# Patient Record
Sex: Male | Born: 2000 | Race: White | Hispanic: No | Marital: Single | Smoking: Never smoker
Health system: Southern US, Community
[De-identification: ages and names within clinical notes are randomized; demographics above are authoritative.]

---
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
## 2020-07-12 ENCOUNTER — Other Ambulatory Visit: Payer: Self-pay

## 2020-07-12 ENCOUNTER — Emergency Department: Payer: Medicaid Other

## 2020-07-12 ENCOUNTER — Emergency Department
Admission: EM | Admit: 2020-07-12 | Discharge: 2020-07-12 | Disposition: A | Discharge status: Home / Self Care | Payer: Medicaid Other | Attending: Emergency Medicine | Admitting: Emergency Medicine

## 2020-07-12 DIAGNOSIS — R0602 Shortness of breath: Secondary | ICD-10-CM | POA: Insufficient documentation

## 2020-07-12 DIAGNOSIS — R0789 Other chest pain: Secondary | ICD-10-CM | POA: Insufficient documentation

## 2020-07-12 LAB — CBC
HCT: 46.2 % (ref 39.0–52.0)
Hemoglobin: 16 g/dL (ref 13.0–17.0)
MCH: 29.7 pg (ref 26.0–34.0)
MCHC: 34.6 g/dL (ref 30.0–36.0)
MCV: 85.9 fL (ref 80.0–100.0)
Platelets: 271 10*3/uL (ref 150–400)
RBC: 5.38 MIL/uL (ref 4.22–5.81)
RDW: 12.5 % (ref 11.5–15.5)
WBC: 10 10*3/uL (ref 4.0–10.5)
nRBC: 0 % (ref 0.0–0.2)

## 2020-07-12 LAB — TROPONIN I (HIGH SENSITIVITY)
Troponin I (High Sensitivity): 2 ng/L (ref <18)
Troponin I (High Sensitivity): <2 ng/L (ref <18)

## 2020-07-12 LAB — BASIC METABOLIC PANEL
Anion gap: 10 (ref 5–15)
BUN: 17 mg/dL (ref 6–20)
CO2: 24 mmol/L (ref 22–32)
Calcium: 9.3 mg/dL (ref 8.9–10.3)
Chloride: 102 mmol/L (ref 98–111)
Creatinine, Ser: 1.03 mg/dL (ref 0.61–1.24)
GFR calc non Af Amer: >60 mL/min (ref >60)
Glucose, Bld: 96 mg/dL (ref 70–99)
Potassium: 3.7 mmol/L (ref 3.5–5.1)
Sodium: 136 mmol/L (ref 135–145)

## 2020-07-12 MED ORDER — IBUPROFEN 800 MG PO TABS: 800.0000 mg | ORAL_TABLET | Freq: Three times a day (TID) | ORAL | 0 refills | Status: AC | PRN

## 2020-07-12 NOTE — Discharge Instructions (Signed)
Please use ibuprofen 800 mg every 8 hours for the next 7 days.

## 2020-07-12 NOTE — ED Triage Notes (Signed)
Pt with central chest tightness and shortness of breath for three days. Pt appears in no acute distress, denies cough or fever, states pain is worse when lying down.

## 2020-07-12 NOTE — ED Provider Notes (Signed)
[facility] Emergency Department Provider Note   ____________________________________________   First MD Initiated Contact with Patient 07/12/20 0800     (approximate)  I have reviewed the triage vital signs and the nursing notes.   HISTORY  Chief Complaint Shortness of Breath and Chest Pain    HPI Ernest Hall is a 19 y.o. male with no stated past medical history that presents for 3 days of worsening the central chest tightness with associated shortness of breath that is worsened with laying down and partially relieved while sitting upright.  Patient describes this pain as 3/10 that worsens to 7/10 when he is laying down.  Patient has not tried any medications for the symptoms.  Patient denies the shortness of breath or chest pain being exertional in nature.  Patient denies any personal or family history of heart disease.         No past medical history on file.  There are no problems to display for this patient.   No past surgical history on file.  Prior to Admission medications   Medication Sig Start Date End Date Taking? Authorizing Provider  ibuprofen (ADVIL) 800 MG tablet Take 1 tablet (800 mg total) by mouth every 8 (eight) hours as needed. 07/12/20   Merwyn Katos, MD    Allergies Amoxicillin and Penicillins  No family history on file.  Social History Social History   Tobacco Use  . Smoking status: Never Smoker  . Smokeless tobacco: Never Used  Vaping Use  . Vaping Use: Never used  Substance Use Topics  . Alcohol use: Never  . Drug use: Never    Review of Systems Constitutional: No fever/chills Eyes: No visual changes. ENT: No sore throat. Cardiovascular: Endorses chest pain. Respiratory: Endorses shortness of breath. Gastrointestinal: No abdominal pain.  No nausea, no vomiting.  No diarrhea. Genitourinary: Negative for dysuria. Musculoskeletal: Negative for acute arthralgias Skin: Negative for rash. Neurological:  Negative for headaches, weakness/numbness/paresthesias in any extremity Psychiatric: Negative for suicidal ideation/homicidal ideation   ____________________________________________   PHYSICAL EXAM:  VITAL SIGNS: ED Triage Vitals  Enc Vitals Group     BP 07/12/20 0450 (!) 161/89     Pulse Rate 07/12/20 0450 73     Resp 07/12/20 0450 18     Temp 07/12/20 0450 98.6 F (37 C)     Temp Source 07/12/20 0450 Oral     SpO2 07/12/20 0450 99 %     Weight 07/12/20 0451 300 lb (136.1 kg)     Height 07/12/20 0451 6\' 1"  (1.854 m)     Head Circumference --      Peak Flow --      Pain Score 07/12/20 0451 4     Pain Loc --      Pain Edu? --      Excl. in GC? --    Constitutional: Alert and oriented. Well appearing and in no acute distress. Eyes: Conjunctivae are normal. PERRL. Head: Atraumatic. Nose: No congestion/rhinnorhea. Mouth/Throat: Mucous membranes are moist. Neck: No stridor Cardiovascular: Grossly normal heart sounds with very mild rub when patient is laying flat.  Good peripheral circulation. Respiratory: Normal respiratory effort.  No retractions. Gastrointestinal: Soft and nontender. No distention. Musculoskeletal: No obvious deformities Neurologic:  Normal speech and language. No gross focal neurologic deficits are appreciated. Skin:  Skin is warm and dry. No rash noted. Psychiatric: Mood and affect are normal. Speech and behavior are normal.  ____________________________________________   LABS (all labs ordered are listed, but  only abnormal results are displayed)  Labs Reviewed  BASIC METABOLIC PANEL  CBC  TROPONIN I (HIGH SENSITIVITY)  TROPONIN I (HIGH SENSITIVITY)   ____________________________________________  EKG  ED ECG REPORT I, Merwyn Katos, the attending physician, personally viewed and interpreted this ECG.  Date: 07/12/2020 EKG Time: 0450 Rate: 89 Rhythm: normal sinus rhythm QRS Axis: normal Intervals: normal ST/T Wave abnormalities:  normal Narrative Interpretation: no evidence of acute ischemia  ____________________________________________  RADIOLOGY  ED MD interpretation: 2 view x-ray of the chest shows no evidence of acute findings including negative for pneumonia, pneumothorax, or widened mediastinum  Official radiology report(s): DG Chest 2 View  Result Date: 07/12/2020 CLINICAL DATA:  Shortness of breath and chest pain EXAM: CHEST - 2 VIEW COMPARISON:  None. FINDINGS: Normal heart size and mediastinal contours. Azygos fissure. No acute infiltrate or edema. No effusion or pneumothorax. No acute osseous findings. Exaggerated thoracic kyphosis with mild vertebral body anterior wedging, Scheuermann disease findings. IMPRESSION: No acute finding. Electronically Signed   By: Marnee Spring M.D.   On: 07/12/2020 05:43    ____________________________________________   PROCEDURES  Procedure(s) performed (including Critical Care):  .1-3 Lead EKG Interpretation Performed by: Merwyn Katos, MD Authorized by: Merwyn Katos, MD     Interpretation: normal     ECG rate:  81   ECG rate assessment: normal     Rhythm: sinus rhythm     Ectopy: none     Conduction: normal       ____________________________________________   INITIAL IMPRESSION / ASSESSMENT AND PLAN / ED COURSE  As part of my medical decision making, I reviewed the following data within the electronic MEDICAL RECORD NUMBER Nursing notes reviewed and incorporated, Labs reviewed, EKG interpreted, Old chart reviewed, Radiograph reviewed and Notes from prior ED visits reviewed and incorporated        Workup: ECG, CXR, CBC, BMP, Troponin Findings: ECG: No overt evidence of STEMI. No evidence of Brugadas sign, delta wave, epsilon wave, significantly prolonged QTc, or malignant arrhythmia HS Troponin: Negative x1 Other Labs unremarkable for emergent problems. CXR: Without PTX, PNA, or widened mediastinum Last Stress Test: Never Last Heart  Catheterization: Never HEART Score: 1  Given History, Exam, and Workup I have low suspicion for ACS, Pneumothorax, Pneumonia, Pulmonary Embolus, Tamponade, Aortic Dissection or other emergent problem as a cause for this presentation.   Reassesment: Prior to discharge patients pain was controlled and they were well appearing.  Disposition:  Discharge. Strict return precautions discussed with patient with full understanding. Advised patient to follow up promptly with primary care provider       ____________________________________________   FINAL CLINICAL IMPRESSION(S) / ED DIAGNOSES  Final diagnoses:  Atypical chest pain  Shortness of breath     ED Discharge Orders         Ordered    ibuprofen (ADVIL) 800 MG tablet  Every 8 hours PRN        07/12/20 0837           Note:  This document was prepared using Dragon voice recognition software and may include unintentional dictation errors.   Merwyn Katos, MD 07/12/20 [phone]

## 2021-04-08 IMAGING — CR DG CHEST 2V
2 series · 2 of 2 positions shown · non-contrast
Comparison: None.

CLINICAL DATA: Shortness of breath and chest pain

EXAM:
CHEST - 2 VIEW

[chest pa]
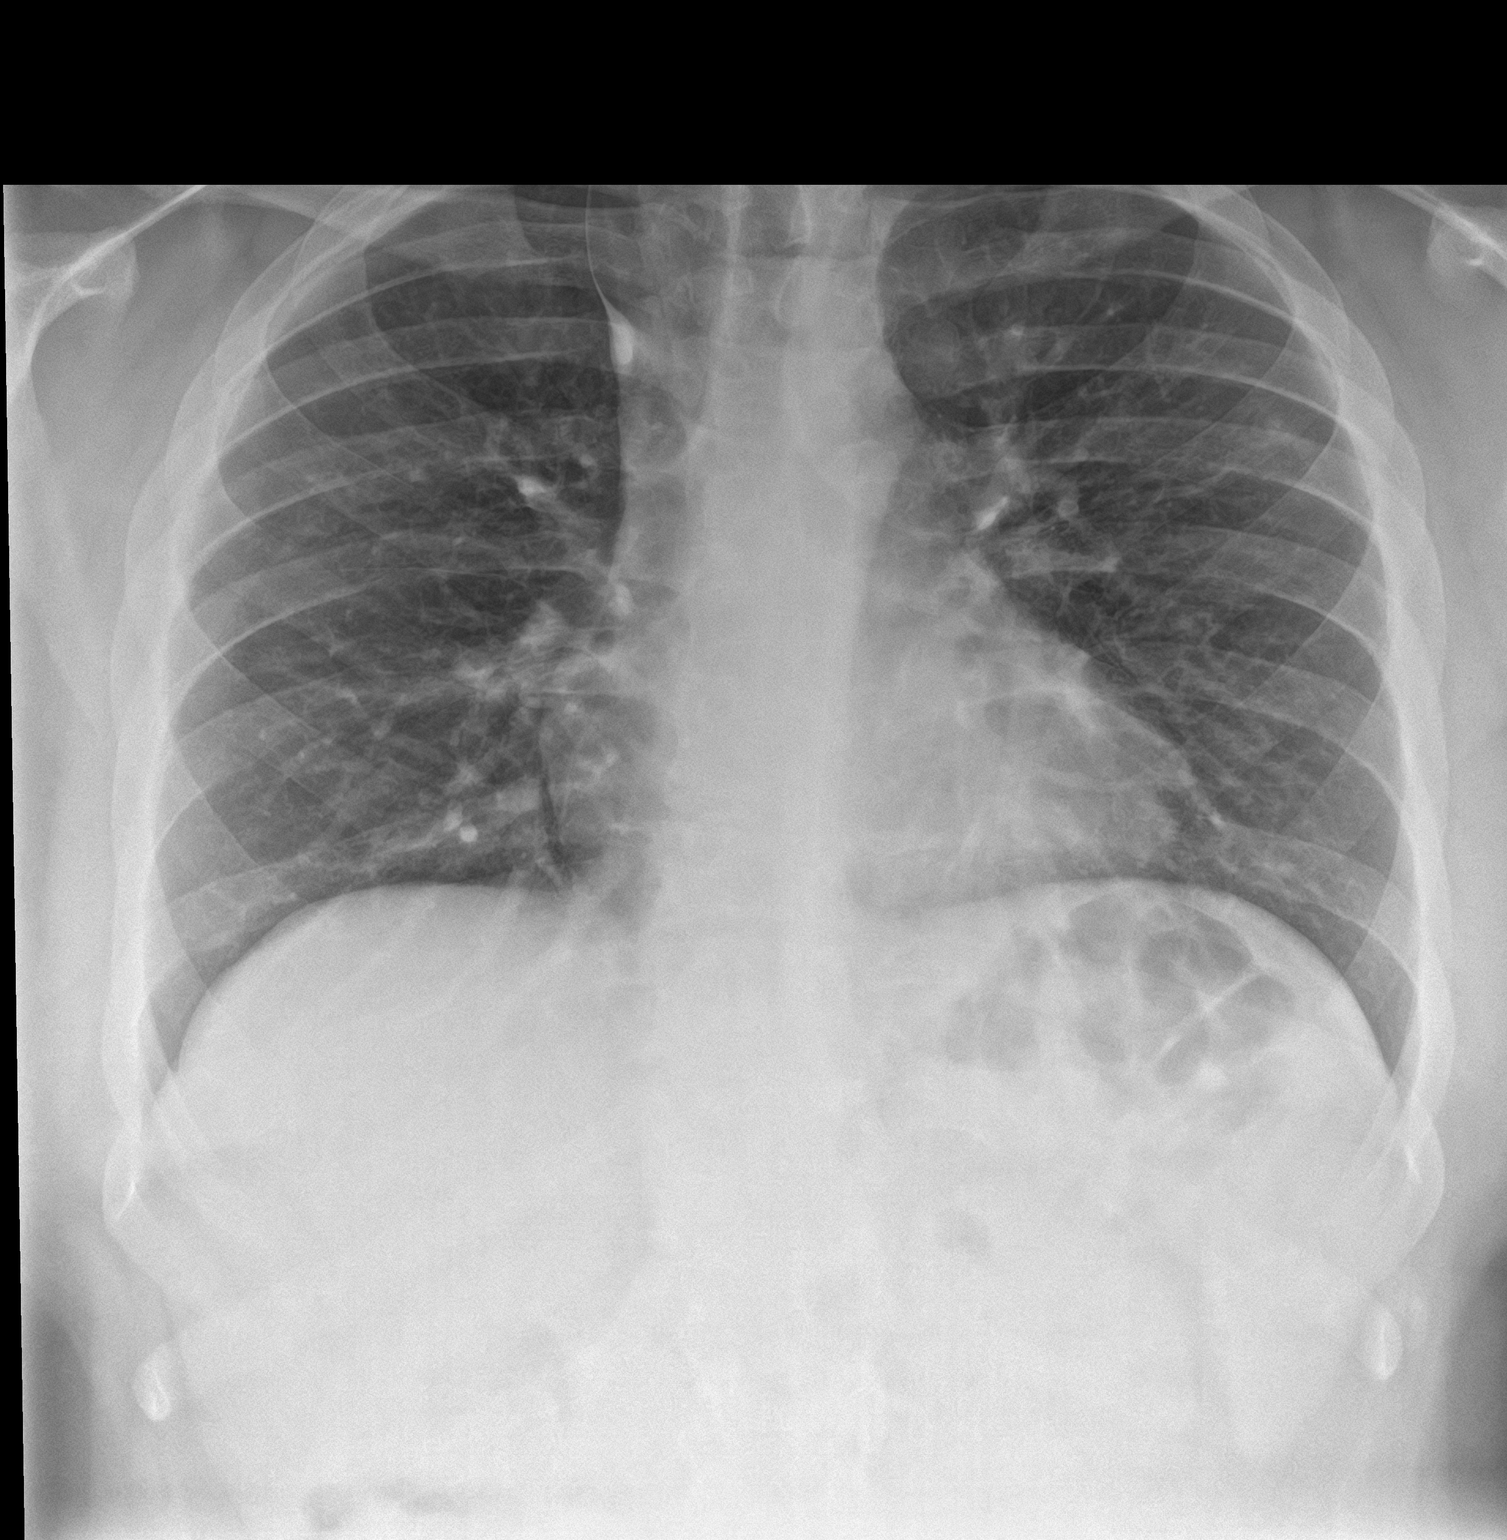

[chest lat]
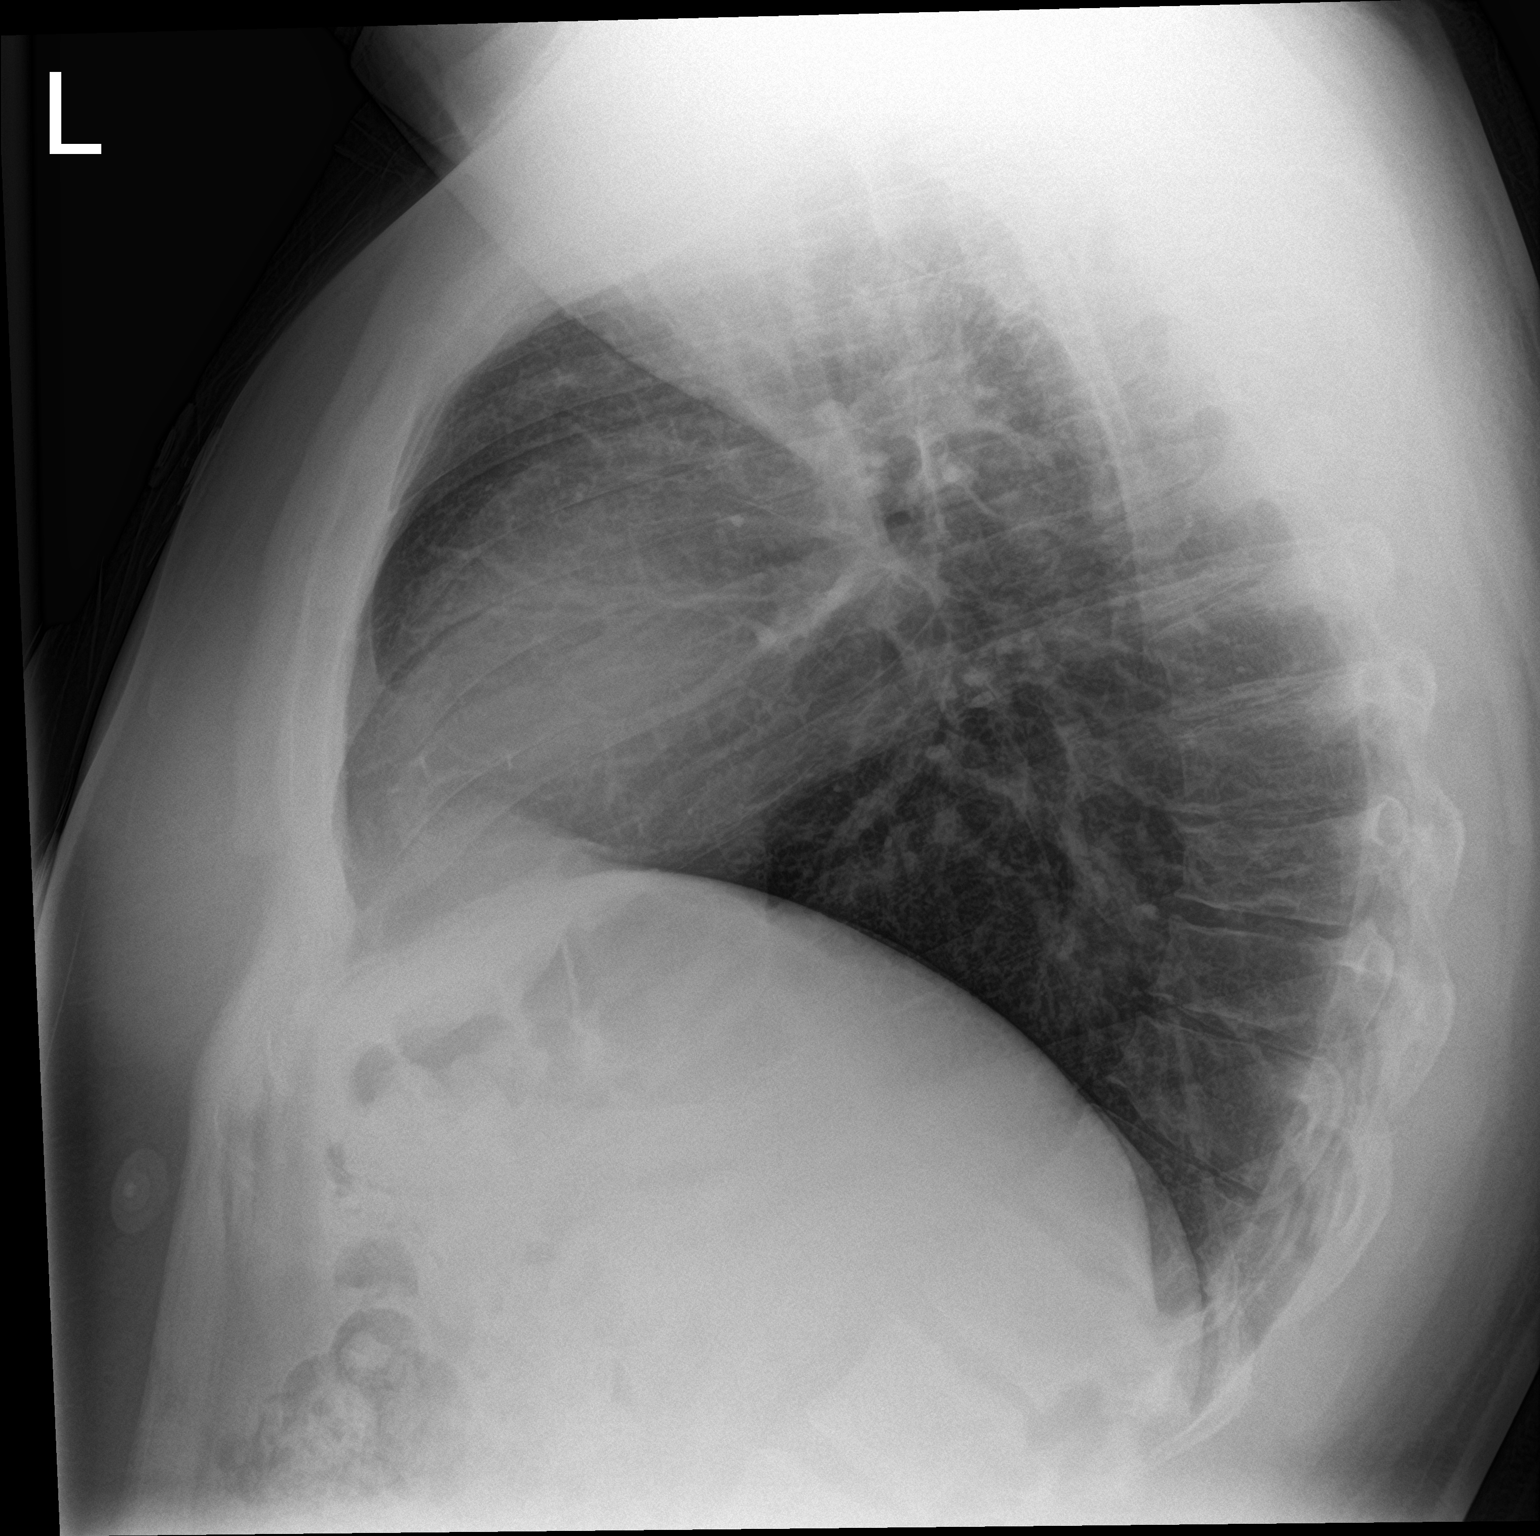

[2 of 2 positions shown; findings below may reference images not displayed]

FINDINGS: Normal heart size and mediastinal contours. Azygos fissure. No acute
infiltrate or edema. No effusion or pneumothorax. No acute osseous
findings. Exaggerated thoracic kyphosis with mild vertebral body
anterior wedging, Bergquist disease findings.
IMPRESSION: No acute finding.
